# Patient Record
Sex: Male | Born: 2000
Health system: Southern US, Community
[De-identification: ages and names within clinical notes are randomized; demographics above are authoritative.]

## PROBLEM LIST (undated history)

## (undated) DIAGNOSIS — Q632 Ectopic kidney: Secondary | ICD-10-CM

## (undated) HISTORY — PX: FETAL SURGERY FOR CONGENITAL HERNIA: SHX1618

## (undated) HISTORY — DX: Ectopic kidney: Q63.2

---
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
## 2020-01-13 ENCOUNTER — Other Ambulatory Visit: Payer: Self-pay

## 2020-01-13 ENCOUNTER — Ambulatory Visit: Payer: Medicaid Other | Attending: Internal Medicine

## 2020-01-13 DIAGNOSIS — Z20822 Contact with and (suspected) exposure to covid-19: Secondary | ICD-10-CM

## 2020-01-14 LAB — NOVEL CORONAVIRUS, NAA: SARS-CoV-2, NAA: NOT DETECTED

## 2020-01-16 ENCOUNTER — Telehealth: Payer: Self-pay

## 2020-01-16 NOTE — Telephone Encounter (Signed)
Given COVID 19 results, verbalizes understanding. 

## 2020-01-17 ENCOUNTER — Telehealth: Payer: Self-pay | Admitting: *Deleted

## 2020-01-17 ENCOUNTER — Telehealth: Payer: Self-pay

## 2020-01-17 NOTE — Telephone Encounter (Signed)
Pt notified of negative COVID-19 results. Understanding verbalized.  [name]   

## 2020-01-17 NOTE — Telephone Encounter (Signed)
Pt called fax number for her employer, [employer]. The fax is [phone].

## 2020-04-11 DIAGNOSIS — Z20828 Contact with and (suspected) exposure to other viral communicable diseases: Secondary | ICD-10-CM | POA: Diagnosis not present

## 2020-04-23 DIAGNOSIS — J0101 Acute recurrent maxillary sinusitis: Secondary | ICD-10-CM | POA: Diagnosis not present

## 2020-05-08 DIAGNOSIS — J209 Acute bronchitis, unspecified: Secondary | ICD-10-CM | POA: Diagnosis not present

## 2020-05-08 DIAGNOSIS — J029 Acute pharyngitis, unspecified: Secondary | ICD-10-CM | POA: Diagnosis not present

## 2020-09-17 ENCOUNTER — Ambulatory Visit: Payer: BLUE CROSS/BLUE SHIELD | Admitting: Family Medicine

## 2022-02-13 ENCOUNTER — Ambulatory Visit (INDEPENDENT_AMBULATORY_CARE_PROVIDER_SITE_OTHER): Payer: 59 | Admitting: Family Medicine

## 2022-02-13 ENCOUNTER — Encounter: Payer: Self-pay | Admitting: Family Medicine

## 2022-02-13 VITALS — BP 114/63 | HR 79 | Temp 98.3°F | Ht 74.26 in | Wt 209.0 lb

## 2022-02-13 DIAGNOSIS — M542 Cervicalgia: Secondary | ICD-10-CM

## 2022-02-13 DIAGNOSIS — G44209 Tension-type headache, unspecified, not intractable: Secondary | ICD-10-CM

## 2022-02-13 DIAGNOSIS — M5442 Lumbago with sciatica, left side: Secondary | ICD-10-CM | POA: Diagnosis not present

## 2022-02-13 MED ORDER — AMITRIPTYLINE HCL 10 MG PO TABS: 10.0000 mg | ORAL_TABLET | Freq: Every day | ORAL | 2 refills | Status: DC

## 2022-02-13 NOTE — Patient Instructions (Addendum)
Ibuprofen 800 mg with Tylenol 1,000  every 6-8 hours as needed for headaches.  Call in the morning to make sure we have x-ray for your neck and lower back x-rays. If not, tell them to send me a message and I will change orders so they can be done at [facility].   You do not need an appointment. Just come.
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
## 2022-02-14 ENCOUNTER — Ambulatory Visit (INDEPENDENT_AMBULATORY_CARE_PROVIDER_SITE_OTHER): Payer: 59

## 2022-02-14 DIAGNOSIS — M5442 Lumbago with sciatica, left side: Secondary | ICD-10-CM | POA: Diagnosis not present

## 2022-02-14 DIAGNOSIS — M542 Cervicalgia: Secondary | ICD-10-CM

## 2022-02-16 ENCOUNTER — Encounter: Payer: Self-pay | Admitting: Family Medicine

## 2022-03-08 ENCOUNTER — Other Ambulatory Visit: Payer: Self-pay | Admitting: Family Medicine

## 2022-03-08 DIAGNOSIS — G44209 Tension-type headache, unspecified, not intractable: Secondary | ICD-10-CM

## 2022-03-10 NOTE — Telephone Encounter (Signed)
Last office visit 02/13/22 ?Last refill 02/13/22, #30, 2 refills ?Pharmacy wants to change this to a 90 day supply ?

## 2022-03-14 ENCOUNTER — Ambulatory Visit: Payer: 59 | Admitting: Family Medicine

## 2022-03-17 ENCOUNTER — Encounter: Payer: Self-pay | Admitting: Family Medicine

## 2022-07-03 IMAGING — DX DG LUMBAR SPINE 2-3V
2 series · 2 of 2 positions shown · non-contrast
Comparison: X-ray lumbar spine 12/30/2018

CLINICAL DATA: Neck and low back pain

EXAM:
LUMBAR SPINE - 2-3 VIEW

[l-spine ap]
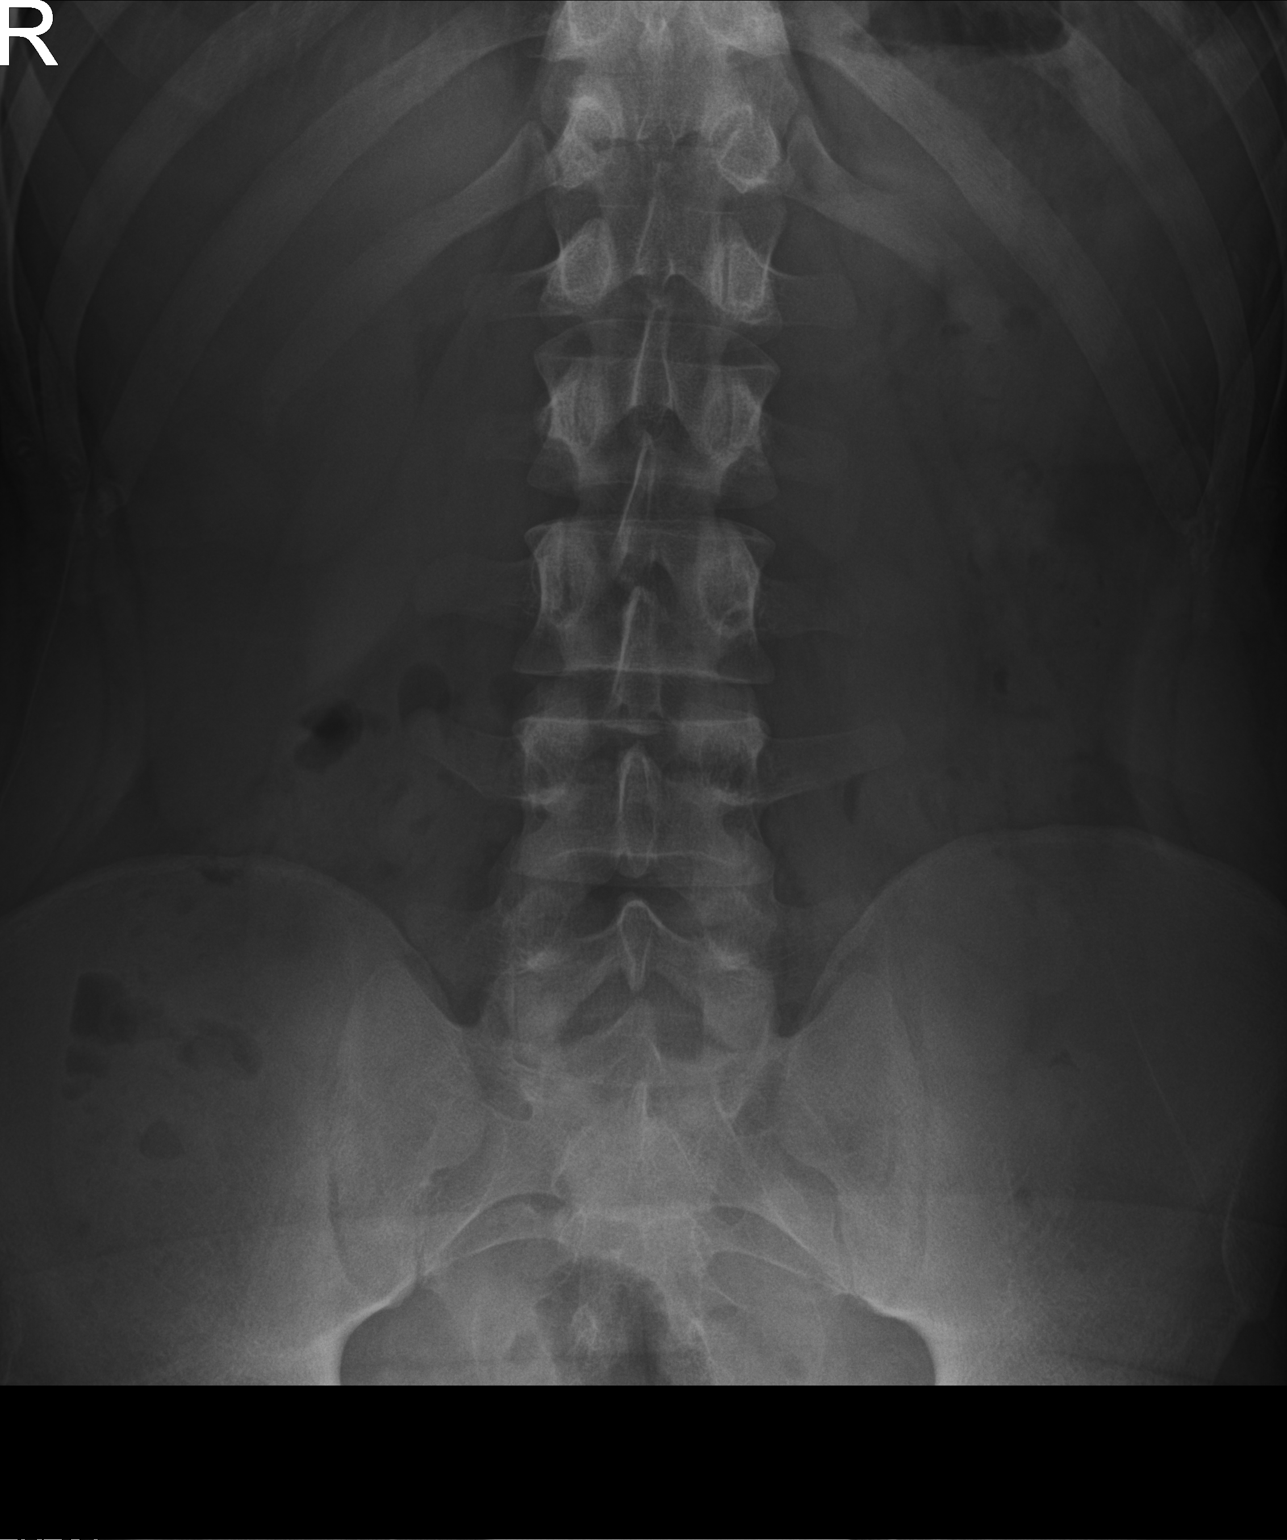

[l-spine lat]
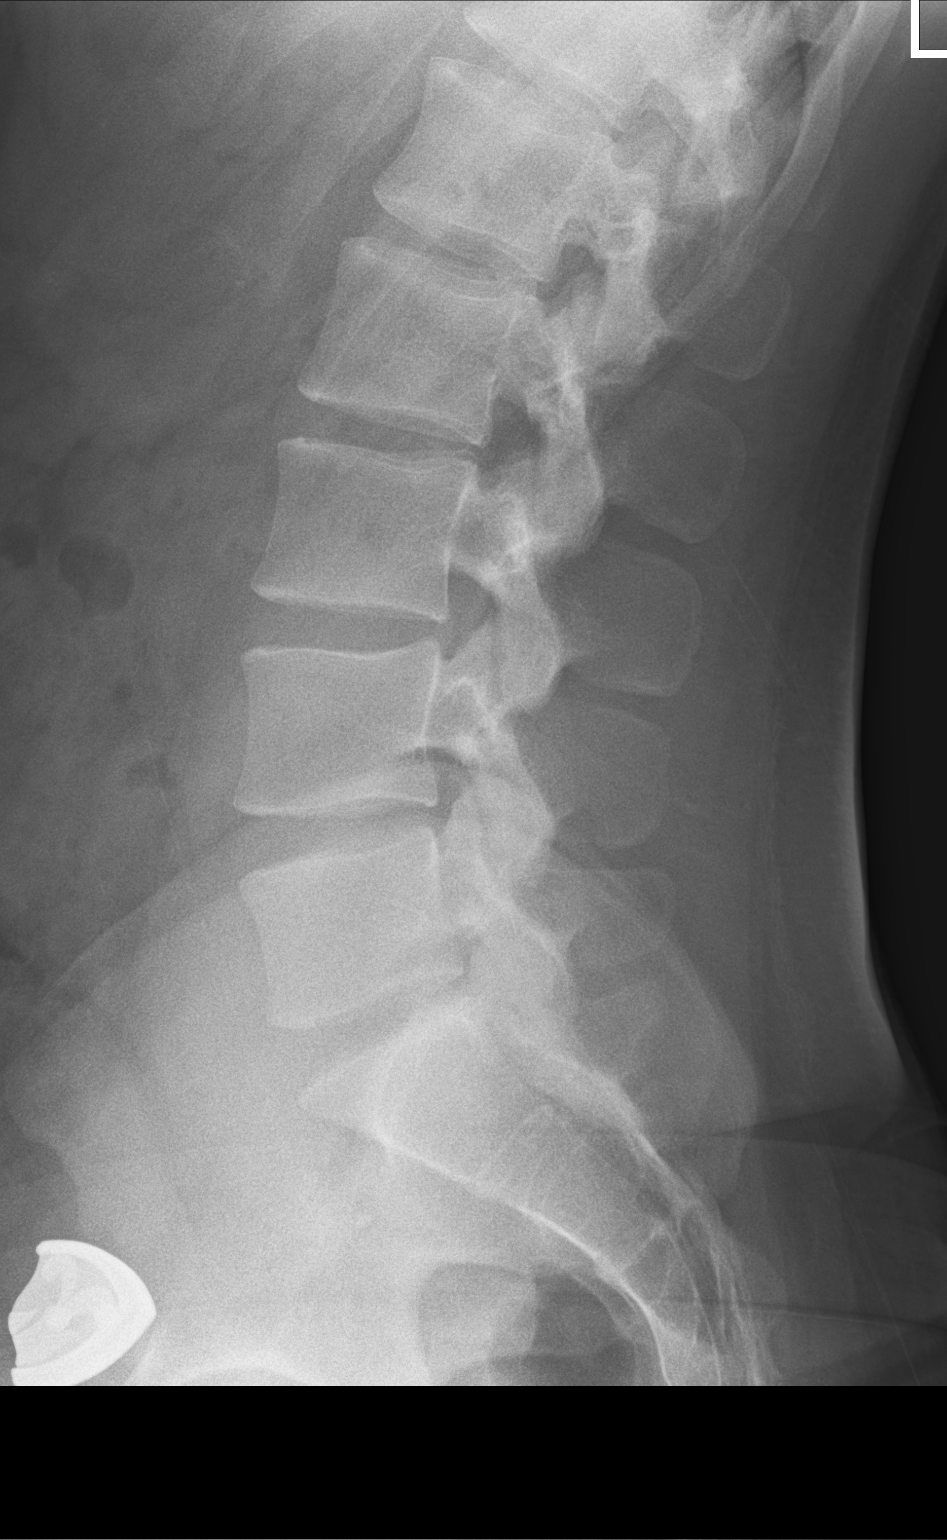

[2 of 2 positions shown; findings below may reference images not displayed]

FINDINGS: Five non-rib-bearing lumbar vertebral bodies. There is no evidence
of lumbar spine fracture. Alignment is normal. Intervertebral disc
spaces are maintained.
IMPRESSION: Negative.
